# Patient Record
Sex: Female | Born: 1980 | Race: White | Hispanic: No | Marital: Married
Health system: Southern US, Community
[De-identification: ages and names within clinical notes are randomized; demographics above are authoritative.]

---

## 2010-08-27 ENCOUNTER — Emergency Department: Payer: Self-pay | Admitting: Emergency Medicine

## 2011-06-30 ENCOUNTER — Emergency Department: Payer: Self-pay | Admitting: *Deleted

## 2011-09-16 ENCOUNTER — Ambulatory Visit: Payer: Self-pay | Admitting: Family Medicine

## 2011-10-27 ENCOUNTER — Encounter: Payer: Self-pay | Admitting: Physician Assistant

## 2011-11-07 ENCOUNTER — Emergency Department: Payer: Self-pay | Admitting: Emergency Medicine

## 2011-11-07 LAB — COMPREHENSIVE METABOLIC PANEL
Alkaline Phosphatase: 69 U/L (ref 50–136)
Anion Gap: 7 (ref 7–16)
Calcium, Total: 8.6 mg/dL (ref 8.5–10.1)
Chloride: 104 mmol/L (ref 98–107)
EGFR (Non-African Amer.): >60
Osmolality: 278 (ref 275–301)
Potassium: 4 mmol/L (ref 3.5–5.1)
SGPT (ALT): 21 U/L
Sodium: 140 mmol/L (ref 136–145)

## 2011-11-07 LAB — CBC WITH DIFFERENTIAL/PLATELET
Basophil #: 0 10*3/uL (ref 0.0–0.1)
Basophil %: 0.5 %
Eosinophil #: 0.1 10*3/uL (ref 0.0–0.7)
Eosinophil %: 1.2 %
Lymphocyte #: 3.2 10*3/uL (ref 1.0–3.6)
Lymphocyte %: 33.6 %
MCH: 28.8 pg (ref 26.0–34.0)
MCHC: 32.7 g/dL (ref 32.0–36.0)
MCV: 88 fL (ref 80–100)
Platelet: 267 10*3/uL (ref 150–440)
RBC: 4.98 10*6/uL (ref 3.80–5.20)
RDW: 13.8 % (ref 11.5–14.5)

## 2011-11-07 LAB — TROPONIN I: Troponin-I: <0.02 ng/mL

## 2011-11-07 LAB — PROTIME-INR: INR: 0.9

## 2011-11-07 LAB — APTT: Activated PTT: 31.5 secs (ref 23.6–35.9)

## 2011-11-09 ENCOUNTER — Ambulatory Visit: Payer: Self-pay | Admitting: Neurology

## 2011-11-10 ENCOUNTER — Inpatient Hospital Stay: Payer: Self-pay | Admitting: Internal Medicine

## 2011-11-11 LAB — SEDIMENTATION RATE: Erythrocyte Sed Rate: 1 mm/hr (ref 0–20)

## 2011-11-13 ENCOUNTER — Ambulatory Visit: Payer: Self-pay | Admitting: Neurology

## 2011-11-13 LAB — CSF CELL COUNT WITH DIFFERENTIAL
CSF Tube #: 3
Eosinophil: 0 %
Monocytes/Macrophages: 0 %
Other Cells: 0 %
RBC (CSF): 2730 /mm3

## 2011-11-13 LAB — PLATELET COUNT: Platelet: 308 10*3/uL (ref 150–440)

## 2011-11-13 LAB — HEMOGLOBIN: HGB: 15 g/dL (ref 12.0–16.0)

## 2011-11-16 LAB — CSF CULTURE

## 2011-11-27 ENCOUNTER — Encounter: Payer: Self-pay | Admitting: Physician Assistant

## 2013-08-19 IMAGING — RF LUMBAR PUNCTURE FLUORO GUIDE
3 series · 6 of 6 positions shown · non-contrast
Comparison: none

REASON FOR EXAM: Left hemibody numbness, weakness, increased tone. Now
with right hemibody sympto
COMMENTS:

[Series 1: fluoro_iodine 2fps_bw · 0.18mm/px · 2 of 2 frames shown (1 of 3)]
[frame 1/2]
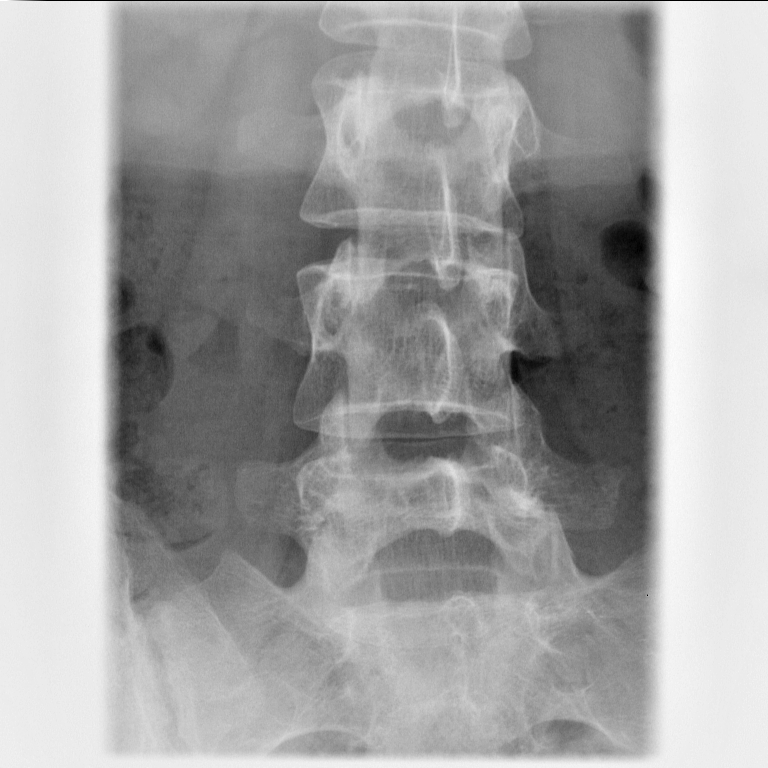
[frame 2/2]
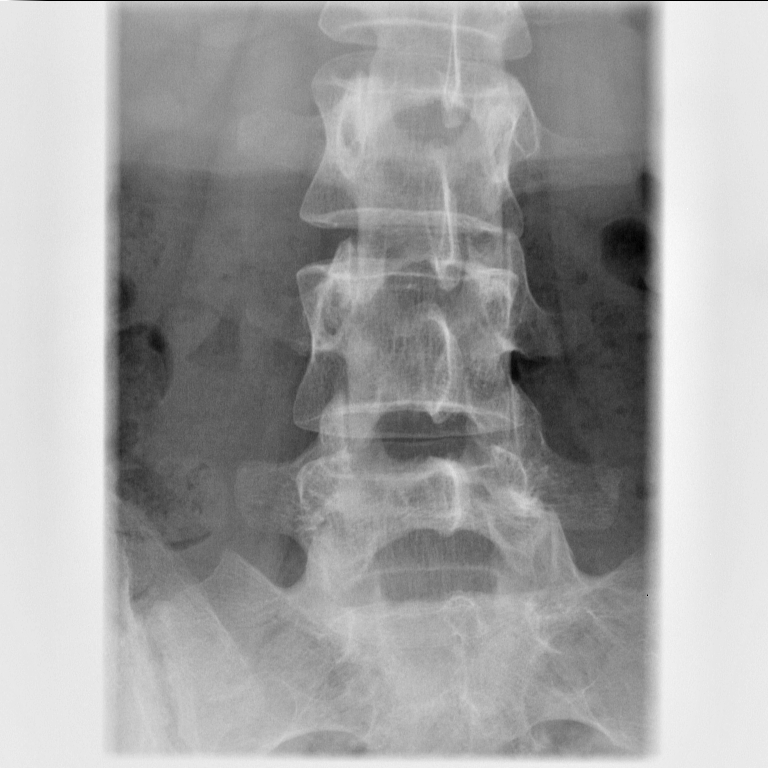

[Series 2: fluoro_iodine 2fps_bw · 0.18mm/px · 2 of 2 frames shown (2 of 3)]
[frame 1/2]
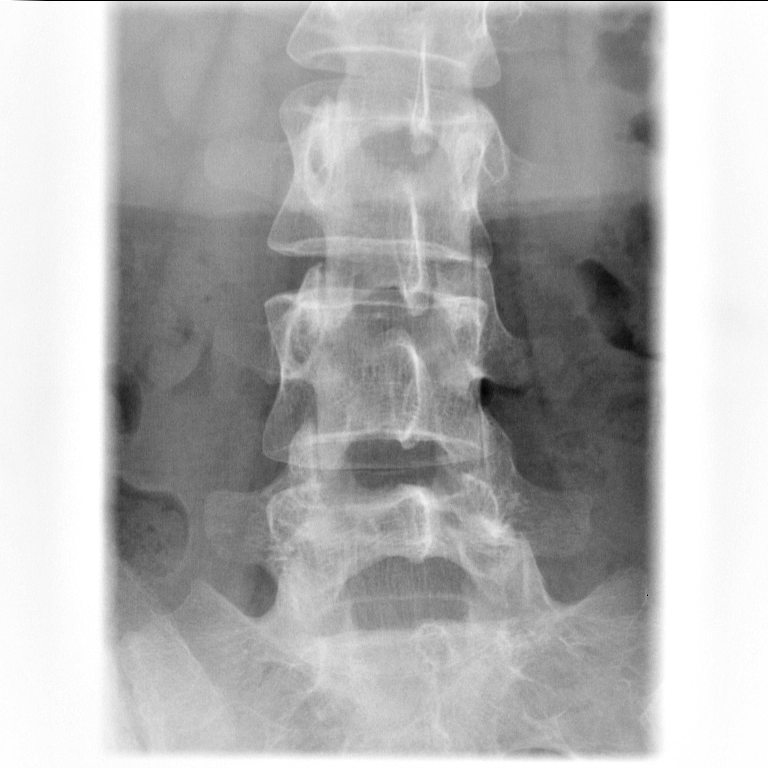
[frame 2/2]
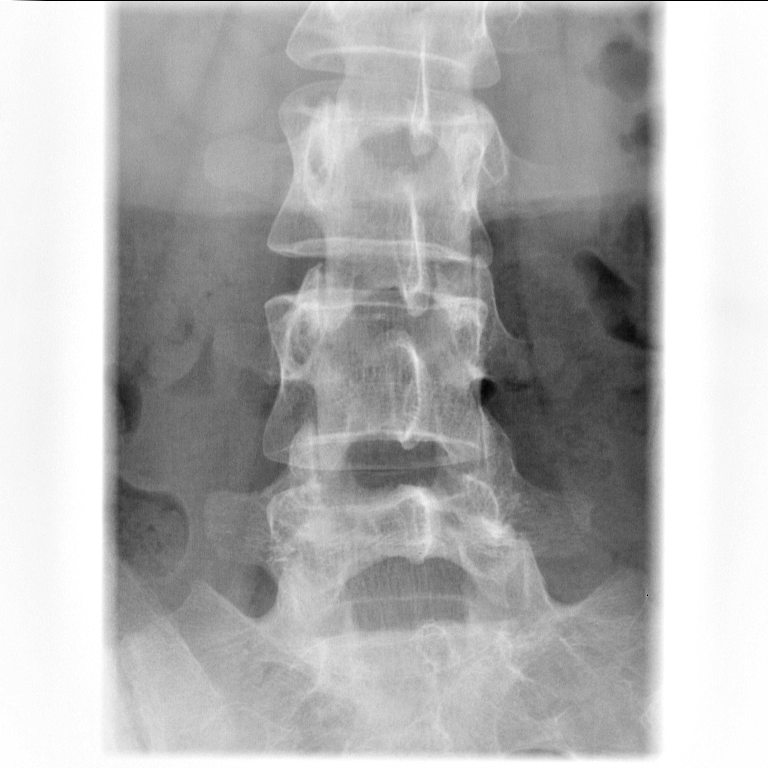

[Series 3: fluoro_iodine 2fps_bw · 0.18mm/px · 2 of 2 frames shown (3 of 3)]
[frame 1/2]
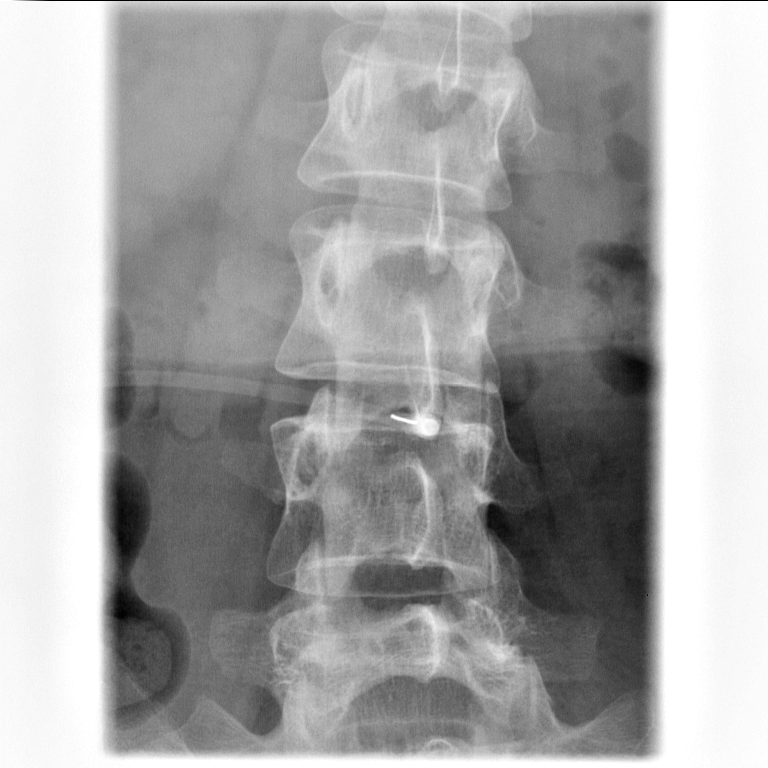
[frame 2/2]
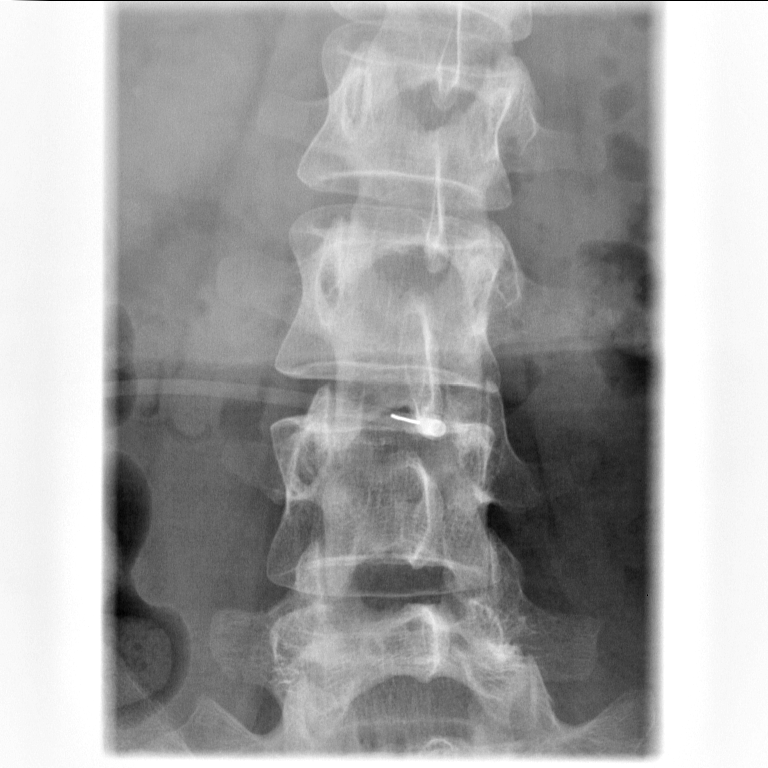

[6 of 6 positions shown; findings below may reference images not displayed]

PROCEDURE:     FL  - FL  GUIDED LUMBAR PUNCTURE  - November 12, 2011 [DATE]

RESULT:     The anticipated procedure was discussed with Ms. Miladys. She
voiced her willingness to proceed. A timeout procedure was called. There was
no contraindication to the use of cutaneous antiseptic or subcutaneous
lidocaine. The patient did express a latex allergy and thus latex free
gloves were employed.

Under fluoroscopic guidance the patient was positioned. The skin was
cleansed with the antiseptic solution. At the L3-L4 level the skin and
subcutaneous tissues were anesthetized with approximately 3 cc of 1%
lidocaine with bicarbonate buffer. Subsequently the 22-gauge spinal needle
was placed into the lumbar subarachnoid space. A tiny flash of CSF was
obtained but the CSF did not come freely. Thus an opening pressure was not
obtained. A sterile flexible catheter was attached to the spinal needle and
CSF was aspirated into a 10 cc syringe. The initial 2 cc were blood tinged
and these were discarded. An additional 10 cc was obtained. A spot film was
performed and the needle withdrawn.
IMPRESSION: The patient underwent successful lumbar puncture with
acquisition of a total of 12 cc of spinal fluid. Ten cc of this fluid was
sent for laboratory analysis as requested by Dr. Avon. The patient tolerated
the procedure well.

## 2014-10-20 NOTE — Discharge Summary (Signed)
PATIENT NAME:  Beth Mills, Beth Mills MR#:  [id number] DATE OF BIRTH:  30-Apr-1981  DATE OF ADMISSION:  11/10/2011 DATE OF DISCHARGE:  11/15/2011  PRIMARY CARE PHYSICIAN: Dr. Quillian QuinceBliss NEUROLOGIST: Dr. Sherryll BurgerShah   FINAL DIAGNOSES:  1. Suspected multiple sclerosis with left leg weakness and spasticity and prior visual symptoms.  2. Weakness.  3. Gastroesophageal reflux disease.  4. Anxiety and insomnia.  5. Constipation.  6. Vitamin D deficiency.   MEDICATIONS ON DISCHARGE:  1. Ativan 1 mg 3 times a day as needed for anxiety. 2. Prilosec 20 mg daily.  3. Baclofen 10 mg 3 times a day.  4. Zofran 4 mg every six hours as needed for nausea.  5. Norco 5/325, 1 tablet every six hours as needed for pain.  6. Prednisone taper as written on prescription. 7. MiraLax 17 grams p.o. daily in 8 ounces of water.  8. Vitamin D 50,000 units weekly.   DIET: Low sodium diet.   ACTIVITY: Activity as tolerated.   REFERRAL:  1. Home health and physical therapy. 2. Neurology, Dr. Sherryll BurgerShah in two weeks. 3. Neurology specialist, Jerl MinaJoel Morgenlander at [facility]. 4. Follow up in 1 to 2 weeks with Dr. Quillian QuinceBliss.   REASON FOR ADMISSION: Patient was admitted 11/10/2011, discharged 11/15/2011, came in with multiple neurological complaints, possible MS.   HISTORY OF PRESENT ILLNESS: 34 year old female who had blurred vision several months ago in her left eye, saw two ophthalmologists, unable to diagnose with any abnormality. She started developing pain, numbness and weakness in her left leg. Saw orthopedics who could not find anything with their studies and referred her to Dr. Sherryll BurgerShah, neurology. Since symptoms were worsening she was recommended in for direct admission for neurological consultation.   LABORATORY, DIAGNOSTIC AND RADIOLOGICAL DATA: Vitamin D 23.1, ANA negative, homocysteine 6.3, vitamin B12 333, CRP 1.3, sedimentation rate 1. MRI thoracic spine normal MRI of thoracic spine. MRI cervical spine normal cervical spine MRI. MRI of the  brain with and without contrast showed normal MRI of the brain. ACE level 32. Immunoglobulins IgA, IgG, IgM normal range. MS profile with myelin basic protein CSF IgG synthetic rate CSF in the normal range, negative 0.42. Another IgG albumin IgG all normal range. Immunoglobulin G index 1396, which is normal range. CSF red blood cells 2730, neutrophils 77%, lymphocytes 23. Culture negative. Celiac disease negative. Copper serum result pending. ANA negative. RPR nonreactive. HIV negative.   HOSPITAL COURSE: Patient was started on high dose Solu-Medrol in the event this was MS 500 mg IV q.12 hours. Patient received six doses and then placed on a prednisone taper as outpatient. Hospital course per problem list:  1. For the patient's suspected MS with left leg weakness, spasticity, prior visual symptoms, all testing has come back negative. Unclear etiology of what is actually going on. Patient was given high dose Solu-Medrol which seemed to improve a little bit with her left leg symptoms but she was still very weak, needed physical therapy and walker to get around. She was started on baclofen for muscle spasm, Norco for pain and she was given a prednisone taper upon discharge. She will follow up with Dr. Sherryll BurgerShah, neurology, in a couple of weeks and he will refer to Dr. Jerl MinaJoel Morgenlander in the future. May end up needing repeat EMG and nerve conduction studies and outpatient visual evoked potential and somatosensory evoked potentials. She will be followed up with outpatient home physical therapy. Advised to ambulate on a daily basis.  2. For weakness, it did seem to improve a  little bit with the IV Solu-Medrol. Will see how things go as outpatient with physical therapy.  3. Gastroesophageal reflux disease. On Prilosec.  4. Anxiety and insomnia. The patient is on Ativan as needed.  5. For the patient's constipation, the patient was given a dose of lactulose prior to discharge. She does have MiraLax at home.  6. For  vitamin D deficiency, she is on 50,000 units weekly of vitamin D.   TIME SPENT ON DISCHARGE: 40 minutes.   ____________________________ Herschell Dimes. [name], MD rjw:cms D: 11/15/2011 17:13:45 ET T: 11/16/2011 11:25:17 ET JOB#: [id number]  cc: Herschell Dimes. [name], MD, <Dictator> Burley Saver, MD Hemang K. Sherryll Burger, MD Salley Scarlet MD ELECTRONICALLY SIGNED 11/18/2011 13:12
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
